# Patient Record
Sex: Male | Born: 1960 | Hispanic: No | Marital: Married | Smoking: Former smoker
Health system: Southern US, Community
[De-identification: ages and names within clinical notes are randomized; demographics above are authoritative.]

## PROBLEM LIST (undated history)

## (undated) DIAGNOSIS — I10 Essential (primary) hypertension: Secondary | ICD-10-CM

## (undated) DIAGNOSIS — E785 Hyperlipidemia, unspecified: Secondary | ICD-10-CM

## (undated) HISTORY — PX: GLAUCOMA REPAIR: SHX214

## (undated) HISTORY — DX: Essential (primary) hypertension: I10

## (undated) HISTORY — DX: Hyperlipidemia, unspecified: E78.5

---

## 2012-11-26 ENCOUNTER — Other Ambulatory Visit: Payer: Self-pay | Admitting: Internal Medicine

## 2012-11-26 DIAGNOSIS — M545 Low back pain: Secondary | ICD-10-CM

## 2012-12-03 ENCOUNTER — Ambulatory Visit
Admission: RE | Admit: 2012-12-03 | Discharge: 2012-12-03 | Disposition: A | Discharge status: Home / Self Care | Payer: No Typology Code available for payment source | Source: Ambulatory Visit | Attending: Internal Medicine | Admitting: Internal Medicine

## 2012-12-03 DIAGNOSIS — M545 Low back pain: Secondary | ICD-10-CM

## 2012-12-04 ENCOUNTER — Other Ambulatory Visit: Payer: Self-pay

## 2013-04-16 HISTORY — PX: BACK SURGERY: SHX140

## 2013-12-14 ENCOUNTER — Other Ambulatory Visit: Payer: Self-pay | Admitting: Internal Medicine

## 2013-12-14 DIAGNOSIS — M545 Low back pain, unspecified: Secondary | ICD-10-CM

## 2013-12-19 ENCOUNTER — Other Ambulatory Visit: Payer: Self-pay

## 2014-08-06 ENCOUNTER — Other Ambulatory Visit: Payer: Self-pay | Admitting: Internal Medicine

## 2014-08-06 DIAGNOSIS — M5410 Radiculopathy, site unspecified: Secondary | ICD-10-CM

## 2014-08-18 ENCOUNTER — Ambulatory Visit
Admission: RE | Admit: 2014-08-18 | Discharge: 2014-08-18 | Disposition: A | Discharge status: Home / Self Care | Payer: BLUE CROSS/BLUE SHIELD | Source: Ambulatory Visit | Attending: Internal Medicine | Admitting: Internal Medicine

## 2014-08-18 DIAGNOSIS — M5410 Radiculopathy, site unspecified: Secondary | ICD-10-CM

## 2019-04-17 HISTORY — PX: HERNIA REPAIR: SHX51

## 2019-05-28 ENCOUNTER — Ambulatory Visit: Payer: BLUE CROSS/BLUE SHIELD | Attending: Internal Medicine

## 2019-05-28 ENCOUNTER — Other Ambulatory Visit: Payer: BLUE CROSS/BLUE SHIELD

## 2019-05-28 DIAGNOSIS — Z20822 Contact with and (suspected) exposure to covid-19: Secondary | ICD-10-CM

## 2019-05-29 LAB — NOVEL CORONAVIRUS, NAA: SARS-CoV-2, NAA: NOT DETECTED

## 2020-08-12 ENCOUNTER — Encounter: Payer: Self-pay | Admitting: Cardiology

## 2020-08-12 ENCOUNTER — Ambulatory Visit: Payer: Self-pay | Admitting: Cardiology

## 2020-08-12 ENCOUNTER — Other Ambulatory Visit: Payer: Self-pay

## 2020-08-12 ENCOUNTER — Telehealth (HOSPITAL_COMMUNITY): Payer: Self-pay | Admitting: Emergency Medicine

## 2020-08-12 VITALS — BP 142/93 | HR 70 | Temp 98.1°F | Resp 17 | Ht 64.0 in | Wt 184.4 lb

## 2020-08-12 DIAGNOSIS — E7800 Pure hypercholesterolemia, unspecified: Secondary | ICD-10-CM

## 2020-08-12 DIAGNOSIS — I10 Essential (primary) hypertension: Secondary | ICD-10-CM

## 2020-08-12 DIAGNOSIS — I208 Other forms of angina pectoris: Secondary | ICD-10-CM

## 2020-08-12 MED ORDER — NITROGLYCERIN 0.4 MG SL SUBL: 0.4000 mg | SUBLINGUAL_TABLET | SUBLINGUAL | 3 refills | Status: AC | PRN

## 2020-08-12 MED ORDER — ATORVASTATIN CALCIUM 40 MG PO TABS: 40.0000 mg | ORAL_TABLET | Freq: Every day | ORAL | 1 refills | Status: DC

## 2020-08-12 MED ORDER — AMLODIPINE BESYLATE 5 MG PO TABS: 5.0000 mg | ORAL_TABLET | Freq: Every evening | ORAL | 1 refills | Status: DC

## 2020-08-12 NOTE — Telephone Encounter (Signed)
Reaching out to patient to offer assistance regarding upcoming cardiac imaging study; pt verbalizes understanding of appt date/time, parking situation and where to check in, pre-test NPO status and medications ordered, and verified current allergies; name and call back number provided for further questions should they arise Rockwell Alexandria RN Navigator Cardiac Imaging [facility] [phone] office [phone] cell   Taking daily metop succ 2 hr prior to scan [place]

## 2020-08-12 NOTE — Progress Notes (Signed)
Primary Physician/Referring:  No primary care provider on file.  Patient ID: Donald Alexander, male    DOB: Apr 07, 1961, 60 y.o.   MRN: [id number]  Chief Complaint  Patient presents with  . New Patient (Initial Visit)       . Chest Pain    Ref by Kirstie Peri, MD  . Hypertension   HPI:    Donald Alexander  is a 60 y.o. Asian [place] male patient with remote history of tobacco use, untreated hypertension and hyperlipidemia, recently started on therapy a month ago, presents for evaluation of left arm discomfort with exertional activity.  For the past 1 month, patient every time he exerts has tightness in his left arm which is relieved with rest.  Otherwise feels well, has no other associated symptoms.  He is accompanied by his wife. He has since reduced his activity. No rest pain. No family history of premature CAD.   Past Medical History:  Diagnosis Date  . Hyperlipidemia   . Hypertension    Past Surgical History:  Procedure Laterality Date  . BACK SURGERY  2015  . GLAUCOMA REPAIR    . HERNIA REPAIR  2021   Family History  Problem Relation Age of Onset  . Osteoarthritis Mother   . Gout Brother     Social History   Tobacco Use  . Smoking status: Former Smoker    Types: Cigarettes    Quit date: 08/12/1988    Years since quitting: 32.0  . Smokeless tobacco: Never Used  Substance Use Topics  . Alcohol use: Yes    Comment: occ   Marital Status: Married  ROS  Review of Systems  Cardiovascular: Positive for chest pain. Negative for dyspnea on exertion and leg swelling.  Gastrointestinal: Negative for melena.   Objective  Blood pressure (!) 142/93, pulse 70, temperature 98.1 F (36.7 C), temperature source Temporal, resp. rate 17, height 5\' 4"  (1.626 m), weight 184 lb 6.4 oz (83.6 kg), SpO2 98 %.   Physical Exam  Constitutional: He appears healthy. No distress.  Eyes: Conjunctivae are normal.  Neck: No JVD present.  Cardiovascular: Regular rhythm, normal heart sounds,  intact distal pulses and normal pulses. Exam reveals no gallop.  No murmur heard. Pulmonary/Chest: Effort normal and breath sounds normal. He exhibits no tenderness.  Abdominal: Soft. Bowel sounds are normal.  Musculoskeletal:        General: No edema. Normal range of motion.     Cervical back: Neck supple.  Neurological: He is alert and oriented to person, place, and time.  Skin: Skin is warm.     Laboratory examination:   No results for input(s): NA, K, CL, CO2, GLUCOSE, BUN, CREATININE, CALCIUM, GFRNONAA, GFRAA in the last 8760 hours. CrCl cannot be calculated (No successful lab value found.).  No flowsheet data found. No flowsheet data found.  Lipid Panel No results for input(s): CHOL, TRIG, LDLCALC, VLDL, HDL, CHOLHDL, LDLDIRECT in the last 8760 hours. Lipid Panel  No results found for: CHOL, TRIG, HDL, CHOLHDL, VLDL, LDLCALC, LDLDIRECT, LABVLDL   HEMOGLOBIN A1C No results found for: HGBA1C, MPG TSH No results for input(s): TSH in the last 8760 hours.  External labs:   Pending from PCP  Medications and allergies   Allergies  Allergen Reactions  . Simvastatin Hives     Current Outpatient Medications on File Prior to Visit  Medication Sig Dispense Refill  . ASPIRIN 81 PO Take 1 tablet by mouth daily.    . metoprolol succinate (TOPROL-XL) 25 MG  24 hr tablet Take 0.5 tablets by mouth daily.     No current facility-administered medications on file prior to visit.     Radiology:   No results found.  Cardiac Studies:   None EKG:     EKG 08/12/2020: Normal sinus rhythm at rate of 68 beats minute, normal axis.  Incomplete right bundle branch block.  No evidence of ischemia, normal EKG.   Assessment     ICD-10-CM   1. Stable angina pectoris (HCC)  I20.8 EKG 12-Lead    amLODipine (NORVASC) 5 MG tablet    nitroGLYCERIN (NITROSTAT) 0.4 MG SL tablet    CT CORONARY MORPH W/CTA COR W/SCORE W/CA W/CM &/OR WO/CM    CT CORONARY FRACTIONAL FLOW RESERVE DATA PREP     CT CORONARY FRACTIONAL FLOW RESERVE FLUID ANALYSIS    Basic metabolic panel  2. Essential hypertension  I10   3. Hypercholesteremia  E78.00 atorvastatin (LIPITOR) 40 MG tablet     Medications Discontinued During This Encounter  Medication Reason  . atorvastatin (LIPITOR) 10 MG tablet Dose change    Meds ordered this encounter  Medications  . amLODipine (NORVASC) 5 MG tablet    Sig: Take 1 tablet (5 mg total) by mouth every evening.    Dispense:  30 tablet    Refill:  1  . atorvastatin (LIPITOR) 40 MG tablet    Sig: Take 1 tablet (40 mg total) by mouth daily.    Dispense:  30 tablet    Refill:  1  . nitroGLYCERIN (NITROSTAT) 0.4 MG SL tablet    Sig: Place 1 tablet (0.4 mg total) under the tongue every 5 (five) minutes as needed for up to 25 days for chest pain.    Dispense:  25 tablet    Refill:  3   Orders Placed This Encounter  Procedures  . CT CORONARY MORPH W/CTA COR W/SCORE W/CA W/CM &/OR WO/CM    Standing Status:   Future    Standing Expiration Date:   09/11/2020    Order Specific Question:   If indicated for the ordered procedure, I authorize the administration of contrast media per Radiology protocol    Answer:   Yes    Order Specific Question:   Preferred Imaging Location?    Answer:   [facility]    Order Specific Question:   Release to patient    Answer:   Immediate  . CT CORONARY FRACTIONAL FLOW RESERVE DATA PREP    FFR Data Prep and Fluid analysis orders will be ordered for pre-authorization.    Standing Status:   Future    Standing Expiration Date:   09/11/2020    Order Specific Question:   Preferred imaging location?    Answer:   [facility]    Order Specific Question:   Radiology Contrast Protocol - do NOT remove file path    Answer:   \\[email or web address].pdf  . CT CORONARY FRACTIONAL FLOW RESERVE FLUID ANALYSIS    FFR Data Prep and Fluid analysis orders will be ordered for pre-authorization.    Standing Status:   Future     Standing Expiration Date:   09/11/2020    Order Specific Question:   Preferred imaging location?    Answer:   [facility]    Order Specific Question:   Radiology Contrast Protocol - do NOT remove file path    Answer:   \\[email or web address].pdf  . Basic metabolic panel  . EKG 12-Lead   Recommendations:   Delrae Rend  is a 60 y.o.  Asian [place] male patient with remote history of tobacco use, untreated hypertension and hyperlipidemia, recently started on therapy a month ago, presents for evaluation of left arm discomfort with exertional activity.   His symptoms are fairly classic for new onset angina pectoris, will increase his atorvastatin from 5 mg to 40 mg daily as I am concerned about high-grade proximal vessel disease.  I will also add amlodipine 5 mg daily both for angina and hypertension control.  Continue aspirin for now.  S/L NTG was prescribed and explained how to and when to use it and to notify us if there is change in frequency of use. Interaction with cialis-like agents (if applicable was discussed). Patient instructed not to do heavy lifting, heavy exertional activity, swimming until evaluation is complete.  Patient instructed to call if symptoms worse or to go to the ED for further evaluation.  I will schedule him for coronary CTA.  Office visit following the work-up/investigations.    Yates Decamp, MD, [facility] 08/13/2020, 5:14 PM Office: [phone] Pager: [phone]

## 2020-08-13 LAB — BASIC METABOLIC PANEL
BUN/Creatinine Ratio: 9 (ref 9–20)
BUN: 10 mg/dL (ref 6–24)
CO2: 22 mmol/L (ref 20–29)
Calcium: 10.2 mg/dL (ref 8.7–10.2)
Chloride: 99 mmol/L (ref 96–106)
Creatinine, Ser: 1.08 mg/dL (ref 0.76–1.27)
Glucose: 114 mg/dL — ABNORMAL HIGH (ref 65–99)
Potassium: 4.6 mmol/L (ref 3.5–5.2)
Sodium: 138 mmol/L (ref 134–144)
eGFR: 80 mL/min/{1.73_m2} (ref >59)

## 2020-08-15 ENCOUNTER — Ambulatory Visit: Payer: Self-pay | Admitting: Cardiology

## 2020-08-16 ENCOUNTER — Ambulatory Visit (HOSPITAL_COMMUNITY)
Admission: RE | Admit: 2020-08-16 | Discharge: 2020-08-16 | Disposition: A | Discharge status: Home / Self Care | Payer: Self-pay | Source: Ambulatory Visit | Attending: Internal Medicine | Admitting: Internal Medicine

## 2020-08-16 ENCOUNTER — Encounter (HOSPITAL_COMMUNITY): Payer: Self-pay

## 2020-08-16 ENCOUNTER — Other Ambulatory Visit: Payer: Self-pay

## 2020-08-16 DIAGNOSIS — I208 Other forms of angina pectoris: Secondary | ICD-10-CM | POA: Insufficient documentation

## 2020-08-16 IMAGING — CT CT HEART MORP W/ CTA COR W/ SCORE W/ CA W/CM &/OR W/O CM
1 series · 8 of 10 positions shown, 10 images · IV contrast (omnipaque)
Comparison: None.
COMPARISON: None.

Addendum:
EXAM:
OVER-READ INTERPRETATION  CT CHEST

The following report is an over-read performed by radiologist Dr.
Glenmore Oafallas [[email or web address]] on 08/16/2020. This
over-read does not include interpretation of cardiac or coronary
anatomy or pathology. The coronary calcium score/coronary CTA
interpretation by the cardiologist is attached.
HISTORY: Chest pain/anginal equiv, ECGs and troponins normal Exertional chest
pain, Hypertension and untreated hypercholesterolemia
Cardiac/Coronary  CT
TECHNIQUE: The patient was scanned on a Siemens Force scanner.
PROTOCOL: A 120 kV prospective scan was triggered in the descending thoracic
aorta at 111 HU's. Axial non-contrast 3 mm slices were carried out
through the heart. The data set was analyzed on a dedicated work
station and scored using the Agatson method. Gantry rotation speed
was 250 msecs and collimation was .6 mm. IV beta blockade and 0.8 mg
of sl NTG was given. The 3D data set was reconstructed in 5%
intervals of the 67-82 % of the R-R cycle. Diastolic phases were
analyzed on a dedicated work station using MPR, MIP and VRT modes.
The patient received 95mL OMNIPAQUE IOHEXOL 350 MG/ML SOLN of
contrast.

[Series 684: — · 0.43mm/px · 8 of 10 slices shown, 10 images]
[im 2/10  vessel]
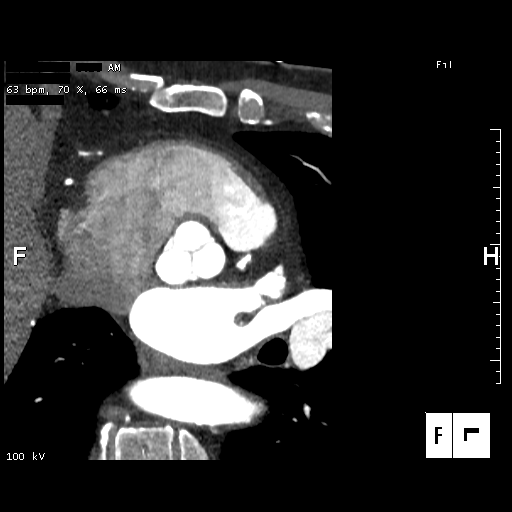
[im 2/10  lung]
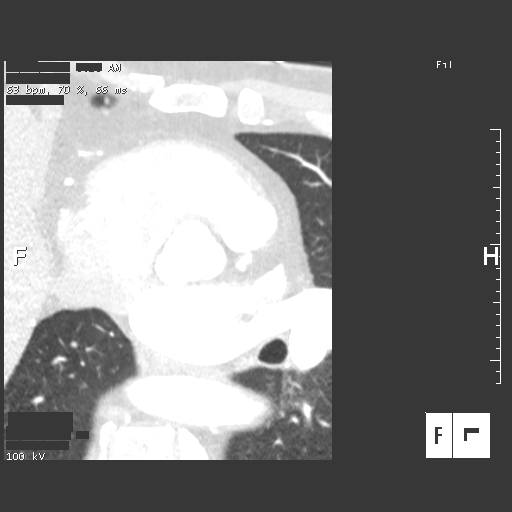
[im 3/10  vessel]
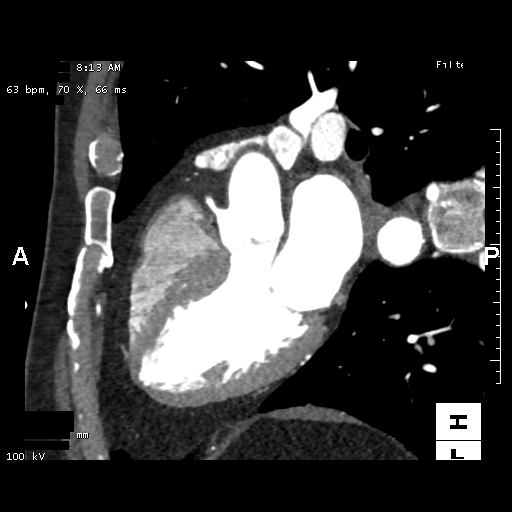
[im 4/10  vessel]
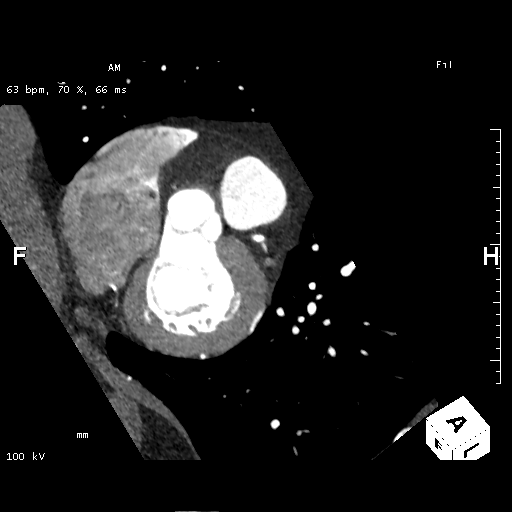
[im 5/10  vessel]
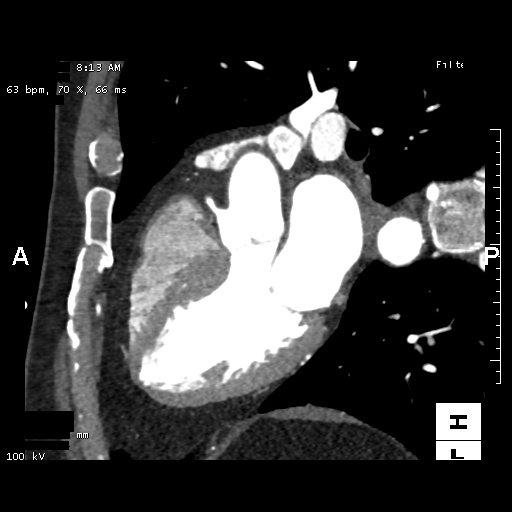
[im 6/10  vessel]
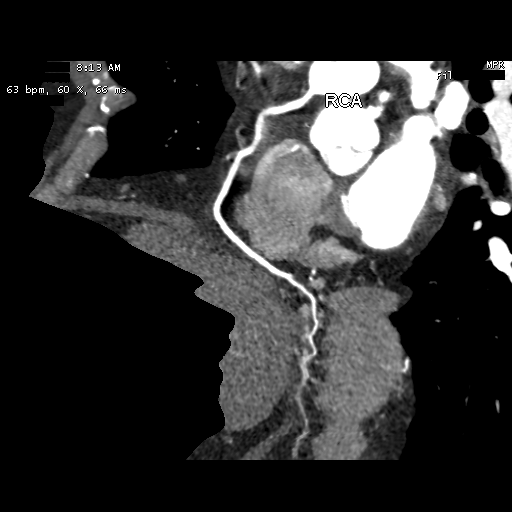
[im 6/10  lung]
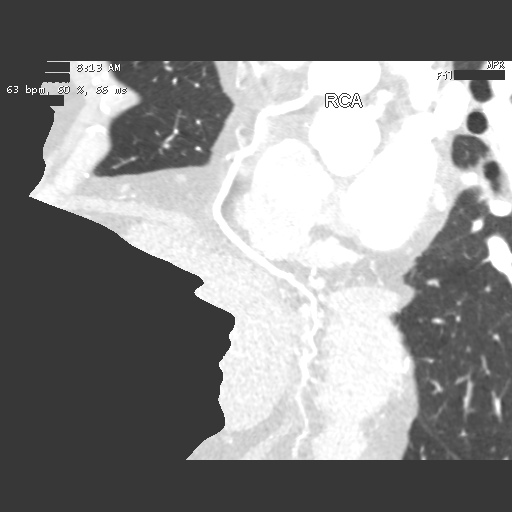
[im 7/10  vessel]
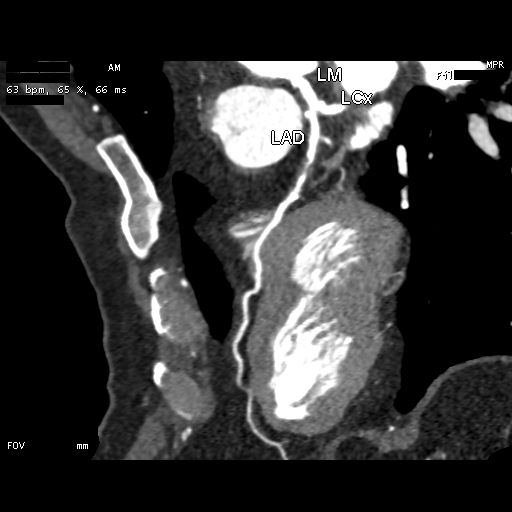
[im 8/10  vessel]
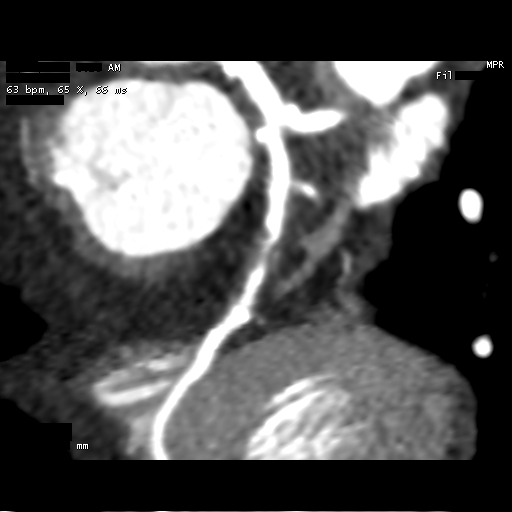
[im 9/10  vessel]
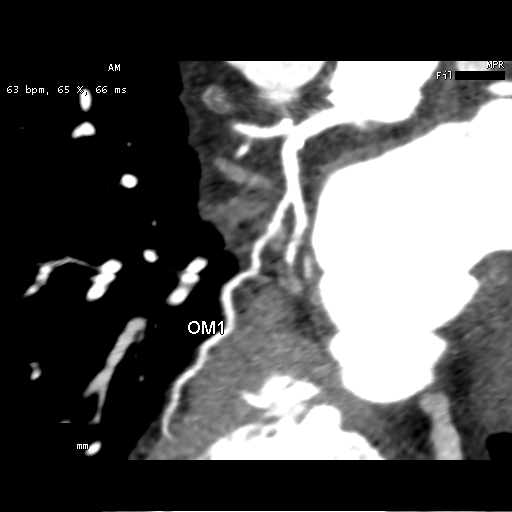

[8 of 10 positions shown; findings below may reference images not displayed]

FINDINGS: Within the visualized portions of the thorax there are no suspicious
appearing pulmonary nodules or masses, there is no acute
consolidative airspace disease, no pleural effusions, no
pneumothorax and no lymphadenopathy. Visualized portions of the
upper abdomen are unremarkable. There are no aggressive appearing
lytic or blastic lesions noted in the visualized portions of the
skeleton.
IMPRESSION: No significant incidental noncardiac findings are noted
FINDINGS: Image quality: excellent.

Noise artifact is: Limited.

Coronary artery calcification score:

Left main:

Left anterior descending artery:

Left circumflex artery: 130

Right coronary artery:

Total coronary calcium score of 237.

Coronary arteries: Normal coronary origins.  Right dominance.

Left Main Coronary Artery: The left main is a normal caliber vessel
with a normal take off from the left coronary cusp that bifurcates
to form a left anterior descending artery and a left circumflex
artery. Minimal stenosis (0-24%) at the ostium due to calcified
plaque otherwise vessel is patent.

Left Anterior Descending Coronary Artery: Normal caliber vessel that
gives off two diagonal branches and reaches the apex. Minimal
stenosis (0-24%) at the ostial LAD due to calcified plaque. Severe
stenosis (70-99%) at proximal / mid LAD due to noncalcified plaque
the lesion is closer to 70% stenosis. Mid to distal LAD overall
patent with minimal calcified plaque.

Left Circumflex Artery: Normal caliber vessel travels within the
atrioventricular groove and gives off one obtuse marginal branch.
Minimal stenosis (0-24%) at the ostial LCx due to calcified plaque.
Moderate stenosis (50-69%) at mid LCx due to noncalcified plaque.
Obtuse marginal branches are overall patent.

Right Coronary Artery: The RCA is dominant with normal take off from
the right coronary cusp. The RCA terminates as a PDA and right
posterolateral branch. Mild stenosis (25-49%) in the mid / distal
segment due to both calcified and noncalcified plaque, otherwise
vessel is overall patent.

Left Atrium: Grossly normal in size with no left atrial appendage
filling defect.

Left Ventricle: Grossly normal in size. There are no stigmata of
prior infarction. There is no abnormal filling defect.

Pulmonary arteries: Normal in size without proximal filling defect.

Pulmonary veins: Normal pulmonary venous drainage.

Aorta: Normal size, 28 mm at the mid ascending aorta (level of the
PA bifurcation) measured double oblique. No calcifications. No
dissection.

Pericardium: Normal thickness with no significant effusion or
calcium present.

Cardiac valves: The aortic valve is trileaflet without
calcification. The mitral valve is normal structure without
calcification.

Extra-cardiac findings: See attached radiology report for
non-cardiac structures.
IMPRESSION: 1.  Total coronary calcium score of 237 KUN.

2. Normal coronary origin with right dominance.

3. CAD-RADS = 4.

Minimal stenosis (0-24%) at the ostium due to calcified plaque
otherwise vessel is patent.

Severe stenosis (70-99%) at proximal / mid LAD due to noncalcified
plaque the lesion is closer to 70% stenosis.

Moderate stenosis (50-69%) at mid LCx due to noncalcified plaque.

Mild stenosis (25-49%) in the mid / distal segment due to both
calcified and noncalcified plaque.

4. Study is sent for CT-FFR findings will be performed and reported
separately.

RECOMMENDATIONS:

Consider symptom-guided anti-ischemic pharmacotherapy as well as
risk factor modification per guideline directed care.

Additional analysis with CT FFR will be submitted to further
evaluate the LAD and LCx lesions.

Consider invasive angiography if either CTFFR notes hemodynamically
significant stenosis or patient continues to have angina despite
uptitration of guideline directed medical therapy and anti-anginal
therapy.

Clinical correlation is recommended.

*** End of Addendum ***
EXAM:
OVER-READ INTERPRETATION  CT CHEST

The following report is an over-read performed by radiologist Dr.
Glenmore Oafallas [REDACTED] on 08/16/2020. This
over-read does not include interpretation of cardiac or coronary
anatomy or pathology. The coronary calcium score/coronary CTA
interpretation by the cardiologist is attached.
FINDINGS: Within the visualized portions of the thorax there are no suspicious
appearing pulmonary nodules or masses, there is no acute
consolidative airspace disease, no pleural effusions, no
pneumothorax and no lymphadenopathy. Visualized portions of the
upper abdomen are unremarkable. There are no aggressive appearing
lytic or blastic lesions noted in the visualized portions of the
skeleton.
IMPRESSION: No significant incidental noncardiac findings are noted

## 2020-08-16 MED ORDER — METOPROLOL TARTRATE 5 MG/5ML IV SOLN
5.0000 mg | INTRAVENOUS | Status: DC | PRN
  Administered 2020-08-16: 5 mg via INTRAVENOUS

## 2020-08-16 MED ORDER — METOPROLOL TARTRATE 5 MG/5ML IV SOLN
INTRAVENOUS | Status: AC
  Filled 2020-08-16: qty 5

## 2020-08-16 MED ORDER — NITROGLYCERIN 0.4 MG SL SUBL
SUBLINGUAL_TABLET | SUBLINGUAL | Status: AC
  Filled 2020-08-16: qty 2

## 2020-08-16 MED ORDER — IOHEXOL 350 MG/ML SOLN
95.0000 mL | Freq: Once | INTRAVENOUS | Status: AC | PRN
  Administered 2020-08-16: 95 mL via INTRAVENOUS

## 2020-08-16 MED ORDER — NITROGLYCERIN 0.4 MG SL SUBL
0.8000 mg | SUBLINGUAL_TABLET | Freq: Once | SUBLINGUAL | Status: AC
  Administered 2020-08-16: 0.8 mg via SUBLINGUAL

## 2020-08-16 NOTE — Progress Notes (Unsigned)
Identify Informed Consent   Subject Name: Donald Alexander  Subject met inclusion and exclusion criteria.  The informed consent form, study requirements and expectations were reviewed with the subject and questions and concerns were addressed prior to the signing of the consent form.  The subject verbalized understanding of the trial requirements.  The subject agreed to participate in the Identify trial and signed the informed consent at [place] on 08/16/2020.  The informed consent was obtained prior to performance of any protocol-specific procedures for the subject.  A copy of the signed informed consent was given to the subject and a copy was placed in the subject's medical record.   [name]

## 2020-08-17 ENCOUNTER — Ambulatory Visit (HOSPITAL_COMMUNITY)
Admission: RE | Admit: 2020-08-17 | Discharge: 2020-08-17 | Disposition: A | Discharge status: Home / Self Care | Payer: Self-pay | Source: Ambulatory Visit | Attending: Cardiology | Admitting: Cardiology

## 2020-08-17 DIAGNOSIS — I208 Other forms of angina pectoris: Secondary | ICD-10-CM | POA: Insufficient documentation

## 2020-08-17 DIAGNOSIS — I2089 Other forms of angina pectoris: Secondary | ICD-10-CM | POA: Insufficient documentation

## 2020-08-18 NOTE — Progress Notes (Signed)
Coronary CTA 08/17/2020: Total coronary calcium score of 237. Left main: 20.6 Left anterior descending artery: 63.9 Left circumflex artery: 130 Right coronary artery: 22.8 Left main is normal.   LAD: Severe stenosis (70 to 99%) at proximal to mid LAD due to noncalcified soft plaque,  CX: 50 to 69% mid circumflex stenosis with noncalcified soft plaque.  Mild stenosis in the distal circumflex with mixed plaque. RCA: PL branch of RCA mild stenosis of 25 to 49%.  Mixed plaque.  Dominant RCA. Both LAD and circumflex stenosis hemodynamically stable by CT FFR. Other visualized noncardiac structures within normal limits.

## 2020-08-19 ENCOUNTER — Other Ambulatory Visit: Payer: Self-pay

## 2020-08-19 ENCOUNTER — Ambulatory Visit: Payer: Self-pay | Admitting: Cardiology

## 2020-08-19 ENCOUNTER — Encounter: Payer: Self-pay | Admitting: Cardiology

## 2020-08-19 VITALS — BP 156/88 | HR 76 | Temp 98.3°F | Resp 17 | Ht 64.0 in | Wt 185.0 lb

## 2020-08-19 DIAGNOSIS — I25118 Atherosclerotic heart disease of native coronary artery with other forms of angina pectoris: Secondary | ICD-10-CM

## 2020-08-19 DIAGNOSIS — I208 Other forms of angina pectoris: Secondary | ICD-10-CM

## 2020-08-19 DIAGNOSIS — E7800 Pure hypercholesterolemia, unspecified: Secondary | ICD-10-CM

## 2020-08-19 DIAGNOSIS — I10 Essential (primary) hypertension: Secondary | ICD-10-CM

## 2020-08-19 MED ORDER — METOPROLOL SUCCINATE ER 50 MG PO TB24: 50.0000 mg | ORAL_TABLET | Freq: Every day | ORAL | 2 refills | Status: DC

## 2020-08-19 MED ORDER — AMLODIPINE BESYLATE 10 MG PO TABS: 10.0000 mg | ORAL_TABLET | Freq: Every day | ORAL | 1 refills | Status: DC

## 2020-08-19 NOTE — Progress Notes (Addendum)
Primary Physician/Referring:  Pcp, No  Patient ID: Donald Alexander, male    DOB: 09-02-1960, 60 y.o.   MRN: [id number]  Chief Complaint  Patient presents with  . Chest Pain   HPI:    Donald Alexander  is a 60 y.o. [place] male patient with remote history of tobacco use, untreated hypertension and hyperlipidemia, recently started on therapy a month ago, presents for evaluation of angina pectoris.  I had increased his atorvastatin from 5 mg to 40 mg and also started him on amlodipine 5 mg and increased his metoprolol succinate from 12.5 mg to 25 mg daily which he is tolerating.  He has noticed improvement in symptoms of angina with exertional activity and is accompanied by his wife.  States that his symptoms of angina has improved since being on aggressive medical therapy and is tolerating all his medications well.  He has continued to do his routine activities but has not exerted himself.  No rest pain, he has not used sublingual nitroglycerin.  Past Medical History:  Diagnosis Date  . Hyperlipidemia   . Hypertension    Past Surgical History:  Procedure Laterality Date  . BACK SURGERY  2015  . GLAUCOMA REPAIR    . HERNIA REPAIR  2021   Family History  Problem Relation Age of Onset  . Osteoarthritis Mother   . Gout Brother     Social History   Tobacco Use  . Smoking status: Former Smoker    Types: Cigarettes    Quit date: 08/12/1988    Years since quitting: 32.0  . Smokeless tobacco: Never Used  Substance Use Topics  . Alcohol use: Yes    Comment: occ   Marital Status: Married  ROS  Review of Systems  Cardiovascular: Positive for chest pain. Negative for dyspnea on exertion and leg swelling.  Gastrointestinal: Negative for melena.   Objective  Blood pressure (!) 156/88, pulse 76, temperature 98.3 F (36.8 C), temperature source Temporal, resp. rate 17, height _0  (1.626 m), weight 185 lb (83.9 kg), SpO2 99 %.   Physical Exam  Constitutional: He appears healthy.  No distress.  Eyes: Conjunctivae are normal.  Neck: No JVD present.  Cardiovascular: Regular rhythm, normal heart sounds, intact distal pulses and normal pulses. Exam reveals no gallop.  No murmur heard. Pulmonary/Chest: Effort normal and breath sounds normal. He exhibits no tenderness.  Abdominal: Soft. Bowel sounds are normal.  Musculoskeletal:        General: No edema. Normal range of motion.     Cervical back: Neck supple.  Neurological: He is alert and oriented to person, place, and time.  Skin: Skin is warm.     Laboratory examination:   Recent Labs    08/12/20 1123  NA 138  K 4.6  CL 99  CO2 22  GLUCOSE 114*  BUN 10  CREATININE 1.08  CALCIUM 10.2   estimated creatinine clearance is 72.9 mL/min (by C-G formula based on SCr of 1.08 mg/dL).  CMP Latest Ref Rng & Units 08/12/2020  Glucose 65 - 99 mg/dL 114(H)  BUN 6 - 24 mg/dL 10  Creatinine 0.76 - 1.27 mg/dL 1.08  Sodium 134 - 144 mmol/L 138  Potassium 3.5 - 5.2 mmol/L 4.6  Chloride 96 - 106 mmol/L 99  CO2 20 - 29 mmol/L 22  Calcium 8.7 - 10.2 mg/dL 10.2   No flowsheet data found.  Lipid Panel No results for input(s): CHOL, TRIG, LDLCALC, VLDL, HDL, CHOLHDL, LDLDIRECT in the last 8760 hours.  Lipid Panel  No results found for: CHOL, TRIG, HDL, CHOLHDL, VLDL, LDLCALC, LDLDIRECT, LABVLDL   HEMOGLOBIN A1C No results found for: HGBA1C, MPG TSH No results for input(s): TSH in the last 8760 hours.  External labs:   Labs 04/05/2020:  Uric acid normal at 7.9.  TSH normal at 2.100.  PSA normal at 0.6.  Total cholesterol 253, triglycerides 189, HDL 31, LDL 186.  Hb 14.8/HCT 45.0, platelets 300.  Mild microcytic index.  Serum glucose _0 mg, BUN 11, creatinine 1.03, EGFR 80 mL, potassium 4.5, CMP otherwise normal.  Medications and allergies   Allergies  Allergen Reactions  . Simvastatin Hives     Current Outpatient Medications on File Prior to Visit  Medication Sig Dispense Refill  . ASPIRIN 81 PO Take  1 tablet by mouth daily.    Marland Kitchen atorvastatin (LIPITOR) 40 MG tablet Take 1 tablet (40 mg total) by mouth daily. 30 tablet 1  . nitroGLYCERIN (NITROSTAT) 0.4 MG SL tablet Place 1 tablet (0.4 mg total) under the tongue every 5 (five) minutes as needed for up to 25 days for chest pain. 25 tablet 3   No current facility-administered medications on file prior to visit.     Radiology:   No results found.  Cardiac Studies:   None EKG:     EKG 08/12/2020: Normal sinus rhythm at rate of 68 beats minute, normal axis.  Incomplete right bundle branch block.  No evidence of ischemia, normal EKG.   Assessment     ICD-10-CM   1. Coronary artery disease of native artery of native heart with stable angina pectoris ([place])  I25.118   2. Stable angina pectoris (HCC)  I20.8   3. Essential hypertension  I10   4. Hypercholesteremia  E78.00      Medications Discontinued During This Encounter  Medication Reason  . metoprolol succinate (TOPROL-XL) 25 MG 24 hr tablet Dose change  . amLODipine (NORVASC) 5 MG tablet Dose change    Meds ordered this encounter  Medications  . metoprolol succinate (TOPROL-XL) 50 MG 24 hr tablet    Sig: Take 1 tablet (50 mg total) by mouth daily. Take with or immediately following a meal.    Dispense:  30 tablet    Refill:  2  . amLODipine (NORVASC) 10 MG tablet    Sig: Take 1 tablet (10 mg total) by mouth daily.    Dispense:  90 tablet    Refill:  1   No orders of the defined types were placed in this encounter.  Recommendations:   Donald Alexander is a 60 y.o.  [place] male patient with remote history of tobacco use, untreated hypertension and hyperlipidemia, recently started on therapy a month ago, presents for evaluation of angina pectoris.  I had increased his atorvastatin from 5 mg to 40 mg and also started him on amlodipine 5 mg and increased his metoprolol succinate from 12.5 mg to 25 mg daily which he is tolerating.  He has noticed improvement in symptoms of  angina with exertional activity and is accompanied by his wife.  I reviewed the results of the coronary CTA and also CT FFR.  Extensive discussion held, patient prefers medical therapy.  High risk in view of proximal LAD stenosis discussed.  States that he will be extremely careful with his diet and exercise and if he has rest pain or he has to sublingual nitroglycerin frequently or has chest discomfort at night, he agrees to present to the emergency room.  He is aware to increase his physical activity slowly and gradually.  He will need lipid profile testing in about 4 to 6 weeks.  Will further increase his metoprolol succinate from 25 mg to 50 mg daily.  Also his blood pressure is still high.  Weight loss extensively discussed with the patient.  Dietary restriction discussed.  I would like to see him back in 3 weeks for follow-up.  Weight loss extensively discussed with the patient.  His wife is present.         Adrian Prows, MD, [facility] 08/20/2020, 9:19 AM Office: [phone] Pager: [phone]

## 2020-09-26 ENCOUNTER — Ambulatory Visit: Payer: Self-pay | Admitting: Cardiology

## 2020-09-26 ENCOUNTER — Encounter: Payer: Self-pay | Admitting: Cardiology

## 2020-09-26 ENCOUNTER — Other Ambulatory Visit: Payer: Self-pay

## 2020-09-26 VITALS — BP 133/75 | HR 60 | Temp 98.2°F | Resp 17 | Ht 64.0 in | Wt 171.2 lb

## 2020-09-26 DIAGNOSIS — I25118 Atherosclerotic heart disease of native coronary artery with other forms of angina pectoris: Secondary | ICD-10-CM

## 2020-09-26 DIAGNOSIS — E7800 Pure hypercholesterolemia, unspecified: Secondary | ICD-10-CM

## 2020-09-26 DIAGNOSIS — I10 Essential (primary) hypertension: Secondary | ICD-10-CM

## 2020-09-26 NOTE — Progress Notes (Signed)
Primary Physician/Referring:  Monico Blitz, MD  Patient ID: Donald Alexander, male    DOB: 04/28/1960, 60 y.o.   MRN: [id number]  Chief Complaint  Patient presents with   Coronary Artery Disease    3 WEEKS   HPI:    Donald Alexander  is a 60 y.o. [place] male patient with remote history of tobacco use, hypertension and hyperlipidemia, CAD by coronary CTA, presents for evaluation of angina pectoris.  [name], since then he has not had any further episodes of angina pectoris.  He is accompanied by his wife.  [name] since last office visit 6 weeks ago and since being on aggressive medical therapy.  He has also lost about 15 pounds in weight and has been very aggressive and dieting and has been walking on a daily basis with his wife without any discomfort.  No rest pain, he has not used sublingual nitroglycerin.  Past Medical History:  Diagnosis Date   Hyperlipidemia    Hypertension    Past Surgical History:  Procedure Laterality Date   BACK SURGERY  2015   GLAUCOMA REPAIR     HERNIA REPAIR  2021   Family History  Problem Relation Age of Onset   Osteoarthritis Mother    Gout Brother     Social History   Tobacco Use   Smoking status: Former    Pack years: 0.00    Types: Cigarettes    Quit date: 08/12/1988    Years since quitting: 32.1   Smokeless tobacco: Never  Substance Use Topics   Alcohol use: Yes    Comment: occ   Marital Status: Married  ROS  Review of Systems  Cardiovascular:  Negative for chest pain, dyspnea on exertion and leg swelling.  Gastrointestinal:  Negative for melena.  Objective  Blood pressure 133/75, pulse 60, temperature 98.2 F (36.8 C), temperature source Temporal, resp. rate 17, height _0  (1.626 m), weight 171 lb 3.2 oz (77.7 kg), SpO2 99 %.   Vitals with BMI 09/26/2020 09/26/2020 08/19/2020   Height - _1  -  Weight - 171 lbs 3 oz -  BMI - 16.94 -  Systolic 503 888 280  Diastolic 75 79 88  Pulse 60 61 76     Physical Exam  Constitutional: He appears healthy. No distress.  Eyes: Conjunctivae are normal.  Neck: No JVD present.  Cardiovascular: Regular rhythm, normal heart sounds, intact distal pulses and normal pulses. Exam reveals no gallop.  No murmur heard. Pulmonary/Chest: Effort normal and breath sounds normal. He exhibits no tenderness.  Abdominal: Soft. Bowel sounds are normal.  Musculoskeletal:        General: No edema. Normal range of motion.     Cervical back: Neck supple.  Neurological: He is alert and oriented to person, place, and time.  Skin: Skin is warm.    Laboratory examination:   Recent Labs    08/12/20 1123  NA 138  K 4.6  CL 99  CO2 22  GLUCOSE 114*  BUN 10  CREATININE 1.08  CALCIUM 10.2   CrCl cannot be calculated (Patient's most recent lab result is older than the maximum 21 days allowed.).  CMP Latest Ref Rng & Units 08/12/2020  Glucose 65 - 99 mg/dL 114(H)  BUN 6 - 24 mg/dL 10  Creatinine 0.76 - 1.27 mg/dL 1.08  Sodium 134 - 144 mmol/L  138  Potassium 3.5 - 5.2 mmol/L 4.6  Chloride 96 - 106 mmol/L 99  CO2 20 - 29 mmol/L 22  Calcium 8.7 - 10.2 mg/dL 10.2   No flowsheet data found.  Lipid Panel No results for input(s): CHOL, TRIG, LDLCALC, VLDL, HDL, CHOLHDL, LDLDIRECT in the last 8760 hours. Lipid Panel  No results found for: CHOL, TRIG, HDL, CHOLHDL, VLDL, LDLCALC, LDLDIRECT, LABVLDL    External labs:   Labs 04/05/2020:  Uric acid normal at 7.9.  TSH normal at 2.100.  PSA normal at 0.6.  Total cholesterol 253, triglycerides 189, HDL 31, LDL 186.  Hb 14.8/HCT 45.0, platelets 300.  Mild microcytic index.  Serum glucose _0 mg, BUN 11, creatinine 1.03, EGFR 80 mL, potassium 4.5, CMP otherwise normal.  Medications and allergies   Allergies  Allergen Reactions   Simvastatin Hives     Current Outpatient  Medications on File Prior to Visit  Medication Sig Dispense Refill   amLODipine (NORVASC) 10 MG tablet Take 1 tablet (10 mg total) by mouth daily. 90 tablet 1   ASPIRIN 81 PO Take 1 tablet by mouth daily.     atorvastatin (LIPITOR) 40 MG tablet Take 1 tablet (40 mg total) by mouth daily. 30 tablet 1   metoprolol succinate (TOPROL-XL) 50 MG 24 hr tablet Take 1 tablet (50 mg total) by mouth daily. Take with or immediately following a meal. 30 tablet 2   nitroGLYCERIN (NITROSTAT) 0.4 MG SL tablet Place 1 tablet (0.4 mg total) under the tongue every 5 (five) minutes as needed for up to 25 days for chest pain. 25 tablet 3   No current facility-administered medications on file prior to visit.     Radiology:   No results found.  Cardiac Studies:   Coronary CT angiogram Aug 27, 2020: 1. Total coronary calcium score of 237. Left main: 20.6; Left anterior descending artery: 63.9; Left circumflex artery: 130; Right coronary artery: 22.8 2. Normal coronary origin with right dominance. 3. Severe stenosis (70-99%) at proximal / mid LAD due to noncalcified plaque the lesion is closer to 70% stenosis. 4. Moderate stenosis (50-69%) at mid LCx due to noncalcified plaque. 5. Mild stenosis (25-49%) in the mid / distal segment due to both calcified and noncalcified plaque   1. Left Main: FFR = 0.99 2. LAD: Proximal FFR = 0.98, mid FFR = 0.66, distal FFR = 0.59 3. LCX: Proximal FFR = 0.98, mid FFR = 0.97, distal FFR = 0.79 4. RCA: Proximal FFR = 0.97, mid FFR =0.89, distal FFR = 0.84   IMPRESSION: 1. CT FFR analysis showed significant stenosis at proximal to mid LAD and mid to distal LCx.   RECOMMENDATIONS: Invasive angiography is recommended to further evaluate the stenosis outline in the LAD and LCX distribution.    EKG:   EKG 08/12/2020: Normal sinus rhythm at rate of 68 beats minute, normal axis.  Incomplete right bundle branch block.  No evidence of ischemia, normal EKG.   Assessment      ICD-10-CM   1. Coronary artery disease of native artery of native heart with stable angina pectoris ([place])  I25.118     2. Essential hypertension  I10     3. Hypercholesteremia  E78.00 Lipid Panel With LDL/HDL Ratio    Lipoprotein A (LPA)    Apo A1 + B + Ratio       There are no discontinued medications.   No orders of the defined types were placed in this encounter.  Orders Placed This  Encounter  Procedures   Lipid Panel With LDL/HDL Ratio   Lipoprotein A (LPA)   Apo A1 + B + Ratio    Recommendations:   Donald Alexander is a 60 y.o.  [place] male patient with remote history of tobacco use, hypertension and hyperlipidemia, CAD by coronary CTA, presents for evaluation of angina pectoris.  [name], since then he has not had any further episodes of angina pectoris.  He is accompanied by his wife.  [name] since last office visit 6 weeks ago and since being on aggressive medical therapy.  He has not had any further episodes of angina and he has not used any sublingual nitroglycerin.  His blood pressure is well controlled, he needs lipid profile testing, I will also obtain LPA and APO A1: B ratio.Marland Kitchen  He has lost about 15 pounds in weight over the past 6 weeks.  Advised him to continue to his present lifestyle and I would like to see him back in 6 months for follow-up unless he has recurrence of angina within an hour or threshold for cardiac catheterization.  His wife is present at the bedside and all questions answered.    Adrian Prows, MD, [facility], [place] 09/26/2020, 11:15 PM Office: [phone] Pager: [phone]

## 2020-10-13 ENCOUNTER — Other Ambulatory Visit: Payer: Self-pay

## 2020-10-13 DIAGNOSIS — E7800 Pure hypercholesterolemia, unspecified: Secondary | ICD-10-CM

## 2020-10-13 MED ORDER — ATORVASTATIN CALCIUM 40 MG PO TABS: 40.0000 mg | ORAL_TABLET | Freq: Every day | ORAL | 3 refills | Status: DC

## 2020-10-28 LAB — LIPID PANEL WITH LDL/HDL RATIO
Cholesterol, Total: 104 mg/dL (ref 100–199)
HDL: 31 mg/dL — ABNORMAL LOW (ref >39)
LDL Chol Calc (NIH): 57 mg/dL (ref 0–99)
LDL/HDL Ratio: 1.8 ratio (ref 0.0–3.6)
Triglycerides: 81 mg/dL (ref 0–149)
VLDL Cholesterol Cal: 16 mg/dL (ref 5–40)

## 2020-10-28 LAB — APO A1 + B + RATIO
Apolipo. B/A-1 Ratio: 0.6 ratio (ref 0.0–0.7)
Apolipoprotein A-1: 93 mg/dL — ABNORMAL LOW (ref 101–178)
Apolipoprotein B: 60 mg/dL (ref <90)

## 2020-10-28 LAB — LIPOPROTEIN A (LPA): Lipoprotein (a): 18.6 nmol/L (ref <75.0)

## 2020-11-14 ENCOUNTER — Telehealth: Payer: Self-pay | Admitting: *Deleted

## 2020-11-14 DIAGNOSIS — Z006 Encounter for examination for normal comparison and control in clinical research program: Secondary | ICD-10-CM

## 2020-11-14 NOTE — Telephone Encounter (Signed)
I called patient for 90-day Identify Study phone call. Patient is doing well and having no cardiac symptoms. I reminded patient I would call him in May 2023 for 1-year follow-up.

## 2020-11-28 NOTE — Progress Notes (Signed)
Coronary CTA 08/21/2020: 1. Left Main: FFR = 0.99 2. LAD: Proximal FFR = 0.98, mid FFR = 0.66, distal FFR = 0.59 3. LCX: Proximal FFR = 0.98, mid FFR = 0.97, distal FFR = 0.79 4. RCA: Proximal FFR = 0.97, mid FFR =0.89, distal FFR = 0.84  IMPRESSION: 1. CT FFR analysis showed significant stenosis at proximal to mid LAD and mid to distal LCx.  RECOMMENDATIONS: Invasive angiography is recommended to further evaluate the stenosis outline in the LAD and LCX distribution.

## 2020-12-21 ENCOUNTER — Other Ambulatory Visit: Payer: Self-pay | Admitting: Cardiology

## 2021-01-02 ENCOUNTER — Other Ambulatory Visit: Payer: Self-pay

## 2021-01-02 ENCOUNTER — Telehealth: Payer: Self-pay | Admitting: Cardiology

## 2021-01-02 DIAGNOSIS — E7800 Pure hypercholesterolemia, unspecified: Secondary | ICD-10-CM

## 2021-01-02 MED ORDER — ATORVASTATIN CALCIUM 40 MG PO TABS: 40.0000 mg | ORAL_TABLET | Freq: Every day | ORAL | 3 refills | Status: DC

## 2021-01-02 NOTE — Telephone Encounter (Signed)
Done

## 2021-01-02 NOTE — Telephone Encounter (Signed)
Pt wife calling req refill on medication with a change in pharmacy

## 2021-02-23 ENCOUNTER — Other Ambulatory Visit: Payer: Self-pay | Admitting: Cardiology

## 2021-03-20 ENCOUNTER — Other Ambulatory Visit: Payer: Self-pay | Admitting: Cardiology

## 2021-05-11 ENCOUNTER — Ambulatory Visit: Payer: Self-pay | Admitting: Cardiology

## 2021-05-31 ENCOUNTER — Other Ambulatory Visit: Payer: Self-pay

## 2021-05-31 MED ORDER — AMLODIPINE BESYLATE 10 MG PO TABS: 10.0000 mg | ORAL_TABLET | Freq: Every day | ORAL | 0 refills | Status: DC

## 2021-06-02 ENCOUNTER — Ambulatory Visit: Payer: Self-pay | Admitting: Cardiology

## 2021-06-22 ENCOUNTER — Encounter: Payer: Self-pay | Admitting: Cardiology

## 2021-06-22 ENCOUNTER — Other Ambulatory Visit: Payer: Self-pay

## 2021-06-22 ENCOUNTER — Ambulatory Visit: Payer: Self-pay | Admitting: Cardiology

## 2021-06-22 VITALS — BP 125/73 | HR 65 | Temp 98.4°F | Resp 17 | Ht 64.0 in | Wt 172.6 lb

## 2021-06-22 DIAGNOSIS — E7800 Pure hypercholesterolemia, unspecified: Secondary | ICD-10-CM

## 2021-06-22 DIAGNOSIS — R739 Hyperglycemia, unspecified: Secondary | ICD-10-CM

## 2021-06-22 DIAGNOSIS — I10 Essential (primary) hypertension: Secondary | ICD-10-CM

## 2021-06-22 DIAGNOSIS — I25118 Atherosclerotic heart disease of native coronary artery with other forms of angina pectoris: Secondary | ICD-10-CM

## 2021-06-22 MED ORDER — METOPROLOL SUCCINATE ER 50 MG PO TB24: ORAL_TABLET | ORAL | 2 refills | Status: DC

## 2021-06-22 MED ORDER — ATORVASTATIN CALCIUM 40 MG PO TABS: 40.0000 mg | ORAL_TABLET | Freq: Every day | ORAL | 3 refills | Status: DC

## 2021-06-22 NOTE — Progress Notes (Signed)
? ?Primary Physician/Referring:  Monico Blitz, MD ? ?Patient ID: Donald Alexander, male    DOB: 09-01-1960, 61 y.o.   MRN: [id number] ? ?Chief Complaint  ?Patient presents with  ? Follow-up  ?  6 month  ? Coronary Artery Disease  ? ?HPI:   ? ?Donald Alexander  is a 61 y.o. [place] male patient with remote history of tobacco use, hypertension and hyperlipidemia, CAD by coronary CTA, presents for evaluation of angina pectoris.  [name], since then he has not had any further episodes of angina pectoris.  He is accompanied by his wife. ? ?[name], he has not used sublingual nitroglycerin. ? ?Past Medical History:  ?Diagnosis Date  ? Hyperlipidemia   ? Hypertension   ? ?Past Surgical History:  ?Procedure Laterality Date  ? BACK SURGERY  2015  ? GLAUCOMA REPAIR    ? HERNIA REPAIR  2021  ? ?Family History  ?Problem Relation Age of Onset  ? Osteoarthritis Mother   ? Gout Brother   ?  ?Social History  ? ?Tobacco Use  ? Smoking status: Former  ?  Types: Cigarettes  ?  Quit date: 08/12/1988  ?  Years since quitting: 32.8  ? Smokeless tobacco: Never  ?Substance Use Topics  ? Alcohol use: Yes  ?  Comment: occ  ? ?Marital Status: Married  ?ROS  ?Review of Systems  ?Cardiovascular:  Negative for chest pain, dyspnea on exertion and leg swelling.  ?Gastrointestinal:  Negative for melena.  ?Objective  ?Blood pressure 125/73, pulse 65, temperature 98.4 ?F (36.9 ?C), temperature source Temporal, resp. rate 17, height _0  (1.626 m), weight 172 lb 9.6 oz (78.3 kg), SpO2 98 %.  ? ?Vitals with BMI 06/22/2021 06/22/2021 09/26/2020  ?Height - _1  -  ?Weight - 172 lbs 10 oz -  ?BMI - 29.61 -  ?Systolic 450 388 828  ?Diastolic 73 73 75  ?Pulse 65 68 60  ?  ? Physical Exam  ?Constitutional: He appears healthy. No distress.  ?Eyes: Conjunctivae are normal.  ?Neck: No JVD present.  ?Cardiovascular:  Regular rhythm, normal heart sounds, intact distal pulses and normal pulses. Exam reveals no gallop.  ?No murmur heard. ?Pulmonary/Chest: Effort normal and breath sounds normal. He exhibits no tenderness.  ?Abdominal: Soft. Bowel sounds are normal.  ?Musculoskeletal:     ?   General: No edema. Normal range of motion.  ?   Cervical back: Neck supple.  ?Neurological: He is alert and oriented to person, place, and time.  ?Skin: Skin is warm.   ? ?Laboratory examination:  ? ?Recent Labs  ?  08/12/20 ?1123  ?NA 138  ?K 4.6  ?CL 99  ?CO2 22  ?GLUCOSE 114*  ?BUN 10  ?CREATININE 1.08  ?CALCIUM 10.2  ? ?CrCl cannot be calculated (Patient's most recent lab result is older than the maximum 21 days allowed.).  ?CMP Latest Ref Rng & Units 08/12/2020  ?Glucose 65 - 99 mg/dL 114(H)  ?BUN 6 - 24 mg/dL 10  ?Creatinine 0.76 - 1.27 mg/dL 1.08  ?Sodium 134 - 144 mmol/L 138  ?Potassium 3.5 - 5.2 mmol/L 4.6  ?Chloride 96 - 106 mmol/L 99  ?CO2 20 - 29 mmol/L 22  ?Calcium 8.7 - 10.2 mg/dL 10.2  ? ?No flowsheet data found. ? ?Lipid Panel ?Recent Labs  ?  10/27/20 ?1250  ?CHOL 104  ?TRIG 81  ?  [place] 57  ?HDL 31*  ? ?Lipid Panel  ?   ?Component Value Date/Time  ? CHOL 104 10/27/2020 1250  ? TRIG 81 10/27/2020 1250  ? HDL 31 (L) 10/27/2020 1250  ? LDLCALC 57 10/27/2020 1250  ? LABVLDL 16 10/27/2020 1250  ?  ?Labs 10/27/2020: ?Apolipo. B/A-1 Ratio 0.0 - 0.7 ratio  0.6  ?Apolipoprotein A-1 101 - 178 mg/dL  93 Low   ?Apolipoprotein B <90 mg/dL  60  ? ?Lipoprotein (a) <75.0 nmol/L   18.6  ? ?External labs:  ? ?Labs 04/05/2020: ? ?Uric acid normal at 7.9.  TSH normal at 2.100.  PSA normal at 0.6. ? ?Total cholesterol 253, triglycerides 189, HDL 31, LDL 186. ? ?Hb 14.8/HCT 45.0, platelets 300.  Mild microcytic index. ? ?Serum glucose _0 mg, BUN 11, creatinine 1.03, EGFR 80 mL, potassium 4.5, CMP otherwise normal. ? ?Medications and allergies  ? ?Allergies  ?Allergen Reactions  ? Simvastatin Hives  ?  ? ?Current Outpatient Medications:  ?   amLODipine (NORVASC) 10 MG tablet, Take 1 tablet (10 mg total) by mouth daily., Disp: 90 tablet, Rfl: 0 ?  ASPIRIN 81 PO, Take 1 tablet by mouth daily., Disp: , Rfl:  ?  nitroGLYCERIN (NITROSTAT) 0.4 MG SL tablet, Place 1 tablet (0.4 mg total) under the tongue every 5 (five) minutes as needed for up to 25 days for chest pain., Disp: 25 tablet, Rfl: 3 ?  atorvastatin (LIPITOR) 40 MG tablet, Take 1 tablet (40 mg total) by mouth daily., Disp: 90 tablet, Rfl: 3 ?  metoprolol succinate (TOPROL-XL) 50 MG 24 hr tablet, TAKE 1 TABLET BY MOUTH ONCE DAILY TAKE  WITH  OR  IMMEDIATELY  FOLLOWING  A  MEAL, Disp: 100 tablet, Rfl: 2  ? ?Radiology:  ? ?No results found. ? ?Cardiac Studies:  ? ?Coronary CT angiogram 08/16/2020: ?1. Total coronary calcium score of 237. Left main: 20.6; Left anterior descending artery: 63.9; Left circumflex artery: 130; Right coronary artery: 22.8 ?2. Normal coronary origin with right dominance. ?3. Severe stenosis (70-99%) at proximal / mid LAD due to noncalcified plaque the lesion is closer to 70% stenosis. ?4. Moderate stenosis (50-69%) at mid LCx due to noncalcified plaque. ?5. Mild stenosis (25-49%) in the mid / distal segment due to both calcified and noncalcified plaque ?  ?1. Left Main: FFR = 0.99 ?2. LAD: Proximal FFR = 0.98, mid FFR = 0.66, distal FFR = 0.59 ?3. LCX: Proximal FFR = 0.98, mid FFR = 0.97, distal FFR = 0.79 ?4. RCA: Proximal FFR = 0.97, mid FFR =0.89, distal FFR = 0.84 ?  ?IMPRESSION: ?1. CT FFR analysis showed significant stenosis at proximal to mid LAD and mid to distal LCx. ?  ?RECOMMENDATIONS: ?Invasive angiography is recommended to further evaluate the stenosis outline in the LAD and LCX distribution. ? ? ? ?EKG:  ? ?EKG 06/22/2021: Normal sinus rhythm heart rate of 61 bpm, incomplete right bundle branch block.  No significant change from 07/23/2020. ? ?Assessment  ? ?  ICD-10-CM   ?1. Coronary artery disease of native artery of native heart with stable angina pectoris ([facility])   I25.118 metoprolol succinate (TOPROL-XL) 50 MG 24 hr tablet  ?  ?2. Essential hypertension  I10 EKG 12-Lead  ?  CMP14+EGFR  ?  ?3. Hypercholesteremia  E78.00 Lipid Panel With LDL/HDL Ratio  ?  atorvastatin (LIPITOR) 40 MG tablet  ?  ?4. Hyperglycemia  R73.9 Hgb A1c w/o eAG  ?  ?  ? ?Medications Discontinued  During This Encounter  ?Medication Reason  ? atorvastatin (LIPITOR) 40 MG tablet Reorder  ? metoprolol succinate (TOPROL-XL) 50 MG 24 hr tablet Reorder  ? ?  ?Meds ordered this encounter  ?Medications  ? atorvastatin (LIPITOR) 40 MG tablet  ?  Sig: Take 1 tablet (40 mg total) by mouth daily.  ?  Dispense:  90 tablet  ?  Refill:  3  ? metoprolol succinate (TOPROL-XL) 50 MG 24 hr tablet  ?  Sig: TAKE 1 TABLET BY MOUTH ONCE DAILY TAKE  WITH  OR  IMMEDIATELY  FOLLOWING  A  MEAL  ?  Dispense:  100 tablet  ?  Refill:  2  ? ? ?Orders Placed This Encounter  ?Procedures  ? Lipid Panel With LDL/HDL Ratio  ? CMP14+EGFR  ? Hgb A1c w/o eAG  ? EKG 12-Lead  ? ? ?Recommendations:  ? ?Donald Alexander is a 61 y.o.  [place] male patient with remote history of tobacco use, hypertension and hyperlipidemia, CAD by coronary CTA, presents for evaluation of angina pectoris. ? ?Is presently doing well and has not had recurrence of angina pectoris.  His wife is present. ? ?I reviewed his labs, lipids under excellent control, he needs repeat lipid profile testing along with CMP and A1c evaluation for hyperglycemia as well. ? ?His weight has been steady and I would like him to lose more weight.  He has lost about 12 to 13 pounds in weight since he started seeing me and he has maintained that weight loss.  Extensive discussion was held regarding making dietary changes and again exercise on a daily basis. ? ?No changes in the medications were done today.  I will see him back in 6 months and if he remains stable on annual basis.   ? ? ?Donald Prows, MD, [facility] ?06/22/2021, 2:46 PM ?Office: [phone] ?Pager: [phone]  ?

## 2021-08-30 ENCOUNTER — Other Ambulatory Visit: Payer: Self-pay

## 2021-08-30 ENCOUNTER — Telehealth: Payer: Self-pay | Admitting: Cardiology

## 2021-08-30 MED ORDER — AMLODIPINE BESYLATE 10 MG PO TABS: 10.0000 mg | ORAL_TABLET | Freq: Every day | ORAL | 0 refills | Status: DC

## 2021-08-30 NOTE — Telephone Encounter (Signed)
Patient's wife requesting refill for amlodipine 10 MG. Preferred pharmacy is the one listed.  ?

## 2021-08-30 NOTE — Telephone Encounter (Signed)
Refill has been sent.  °

## 2021-12-08 ENCOUNTER — Other Ambulatory Visit: Payer: Self-pay

## 2021-12-08 MED ORDER — AMLODIPINE BESYLATE 10 MG PO TABS: 10.0000 mg | ORAL_TABLET | Freq: Every day | ORAL | 3 refills | Status: DC

## 2021-12-28 ENCOUNTER — Ambulatory Visit: Payer: Self-pay | Admitting: Cardiology

## 2021-12-28 LAB — CMP14+EGFR
ALT: 26 IU/L (ref 0–44)
AST: 27 IU/L (ref 0–40)
Albumin/Globulin Ratio: 1.3 (ref 1.2–2.2)
Albumin: 4.1 g/dL (ref 3.8–4.9)
Alkaline Phosphatase: 123 IU/L — ABNORMAL HIGH (ref 44–121)
BUN/Creatinine Ratio: 11 (ref 10–24)
BUN: 11 mg/dL (ref 8–27)
Bilirubin Total: 0.7 mg/dL (ref 0.0–1.2)
CO2: 24 mmol/L (ref 20–29)
Calcium: 8.9 mg/dL (ref 8.6–10.2)
Chloride: 101 mmol/L (ref 96–106)
Creatinine, Ser: 0.99 mg/dL (ref 0.76–1.27)
Globulin, Total: 3.1 g/dL (ref 1.5–4.5)
Glucose: 104 mg/dL — ABNORMAL HIGH (ref 70–99)
Potassium: 4.3 mmol/L (ref 3.5–5.2)
Sodium: 138 mmol/L (ref 134–144)
Total Protein: 7.2 g/dL (ref 6.0–8.5)
eGFR: 87 mL/min/{1.73_m2} (ref >59)

## 2021-12-28 LAB — LIPID PANEL WITH LDL/HDL RATIO
Cholesterol, Total: 115 mg/dL (ref 100–199)
HDL: 35 mg/dL — ABNORMAL LOW (ref >39)
LDL Chol Calc (NIH): 64 mg/dL (ref 0–99)
LDL/HDL Ratio: 1.8 ratio (ref 0.0–3.6)
Triglycerides: 77 mg/dL (ref 0–149)
VLDL Cholesterol Cal: 16 mg/dL (ref 5–40)

## 2021-12-28 LAB — HGB A1C W/O EAG: Hgb A1c MFr Bld: 5.6 % (ref 4.8–5.6)

## 2021-12-29 ENCOUNTER — Encounter: Payer: Self-pay | Admitting: Cardiology

## 2021-12-29 ENCOUNTER — Ambulatory Visit: Payer: Self-pay | Admitting: Cardiology

## 2021-12-29 VITALS — BP 130/70 | HR 68 | Temp 98.4°F | Resp 16 | Ht 64.0 in | Wt 171.6 lb

## 2021-12-29 DIAGNOSIS — I10 Essential (primary) hypertension: Secondary | ICD-10-CM

## 2021-12-29 DIAGNOSIS — E7800 Pure hypercholesterolemia, unspecified: Secondary | ICD-10-CM

## 2021-12-29 DIAGNOSIS — I25118 Atherosclerotic heart disease of native coronary artery with other forms of angina pectoris: Secondary | ICD-10-CM

## 2021-12-29 NOTE — Progress Notes (Signed)
Primary Physician/Referring:  Monico Blitz, MD  Patient ID: Donald Alexander, male    DOB: 28-May-1960, 61 y.o.   MRN: [id number]  No chief complaint on file.  HPI:    Donald Alexander  is a 61 y.o. [place] male patient with remote history of tobacco use, hypertension and hyperlipidemia, CAD by coronary CTA, presents for evaluation of angina pectoris.  [name], since then he has not had any further episodes of angina pectoris.  He is accompanied by his wife.  [name], he has not used sublingual nitroglycerin.  Past Medical History:  Diagnosis Date   Hyperlipidemia    Hypertension    Past Surgical History:  Procedure Laterality Date   BACK SURGERY  2015   GLAUCOMA REPAIR     HERNIA REPAIR  2021   Family History  Problem Relation Age of Onset   Osteoarthritis Mother    Gout Brother     Social History   Tobacco Use   Smoking status: Former    Types: Cigarettes    Quit date: 08/12/1988    Years since quitting: 33.4   Smokeless tobacco: Never  Substance Use Topics   Alcohol use: Yes    Comment: occ   Marital Status: Married   ROS   Review of Systems  Cardiovascular:  Negative for chest pain, dyspnea on exertion and leg swelling.  Gastrointestinal:  Negative for melena.   Objective  Blood pressure 130/70, pulse 68, temperature 98.4 F (36.9 C), temperature source Temporal, resp. rate 16, height '5\' 4"'  (1.626 m), weight 171 lb 9.6 oz (77.8 kg), SpO2 97 %.      12/29/2021    2:35 PM 06/22/2021    2:14 PM 06/22/2021    2:04 PM  Vitals with BMI  Height '5\' 4"'   '5\' 4"'   Weight 171 lbs 10 oz  172 lbs 10 oz  BMI 72.90  21.11  Systolic 552 080 223  Diastolic 70 73 73  Pulse 68 65 68    Physical Exam Neck:     Vascular: No carotid bruit or JVD.  Cardiovascular:     Rate and Rhythm: Normal rate and regular rhythm.     Pulses:  Intact distal pulses.     Heart sounds: Normal heart sounds. No murmur heard.    No gallop.  Pulmonary:     Effort: Pulmonary effort is normal.     Breath sounds: Normal breath sounds.  Abdominal:     General: Bowel sounds are normal.     Palpations: Abdomen is soft.  Musculoskeletal:     Right lower leg: No edema.     Left lower leg: No edema.     Laboratory examination:   Recent Labs    12/27/21 0815  NA 138  K 4.3  CL 101  CO2 24  GLUCOSE 104*  BUN 11  CREATININE 0.99  CALCIUM 8.9   estimated creatinine clearance is 74.7 mL/min (by C-G formula based on SCr of 0.99 mg/dL).     Latest Ref Rng & Units 12/27/2021    8:15 AM 08/12/2020   11:23 AM  CMP  Glucose 70 - 99 mg/dL 104  114   BUN 8 - 27 mg/dL 11  10   Creatinine 0.76 - 1.27 mg/dL 0.99  1.08   Sodium 134 - 144 mmol/L 138  138   Potassium 3.5 -  5.2 mmol/L 4.3  4.6   Chloride 96 - 106 mmol/L 101  99   CO2 20 - 29 mmol/L 24  22   Calcium 8.6 - 10.2 mg/dL 8.9  10.2   Total Protein 6.0 - 8.5 g/dL 7.2    Total Bilirubin 0.0 - 1.2 mg/dL 0.7    Alkaline Phos 44 - 121 IU/L 123    AST 0 - 40 IU/L 27    ALT 0 - 44 IU/L 26     Lipid Panel Recent Labs    12/27/21 0815  CHOL 115  TRIG 77  LDLCALC 64  HDL 35*   Lipid Panel     Component Value Date/Time   CHOL 115 12/27/2021 0815   TRIG 77 12/27/2021 0815   HDL 35 (L) 12/27/2021 0815   LDLCALC 64 12/27/2021 0815   LABVLDL 16 12/27/2021 0815    Lab Results  Component Value Date   HGBA1C 5.6 12/27/2021    Labs 10/27/2020: Apolipo. B/A-1 Ratio 0.0 - 0.7 ratio  0.6  Apolipoprotein A-1 101 - 178 mg/dL  93 Low   Apolipoprotein B <90 mg/dL  60   Lipoprotein (a) <75.0 nmol/L   18.6   External labs:   Labs 04/05/2020:  Uric acid normal at 7.9.  TSH normal at 2.100.  PSA normal at 0.6.  Total cholesterol 253, triglycerides 189, HDL 31, LDL 186.  Hb 14.8/HCT 45.0, platelets 300.  Mild microcytic index.  Serum glucose '1 1 4 ' mg, BUN 11, creatinine 1.03,  EGFR 80 mL, potassium 4.5, CMP otherwise normal.  Medications and allergies   Allergies  Allergen Reactions   Simvastatin Hives     Current Outpatient Medications:    amLODipine (NORVASC) 10 MG tablet, Take 1 tablet (10 mg total) by mouth daily., Disp: 90 tablet, Rfl: 3   ASPIRIN 81 PO, Take 1 tablet by mouth daily., Disp: , Rfl:    atorvastatin (LIPITOR) 40 MG tablet, Take 1 tablet (40 mg total) by mouth daily., Disp: 90 tablet, Rfl: 3   metoprolol succinate (TOPROL-XL) 50 MG 24 hr tablet, TAKE 1 TABLET BY MOUTH ONCE DAILY TAKE  WITH  OR  IMMEDIATELY  FOLLOWING  A  MEAL, Disp: 100 tablet, Rfl: 2   nitroGLYCERIN (NITROSTAT) 0.4 MG SL tablet, Place 1 tablet (0.4 mg total) under the tongue every 5 (five) minutes as needed for up to 25 days for chest pain., Disp: 25 tablet, Rfl: 3   VYZULTA 0.024 % SOLN, Apply 1 drop to eye at bedtime., Disp: , Rfl:    Radiology:   No results found.  Cardiac Studies:   Coronary CT angiogram 2020-09-05: 1. Total coronary calcium score of 237. Percentile 95 Left main: 20.6; Left anterior descending artery: 63.9; Left circumflex artery: 130; Right coronary artery: 22.8 2. Normal coronary origin with right dominance. 3. Severe stenosis (70-99%) at proximal / mid LAD due to noncalcified plaque the lesion is closer to 70% stenosis. 4. Moderate stenosis (50-69%) at mid LCx due to noncalcified plaque. 5. Mild stenosis (25-49%) in the mid / distal segment due to both calcified and noncalcified plaque   1. Left Main: FFR = 0.99 2. LAD: Proximal FFR = 0.98, mid FFR = 0.66, distal FFR = 0.59 3. LCX: Proximal FFR = 0.98, mid FFR = 0.97, distal FFR = 0.79 4. RCA: Proximal FFR = 0.97, mid FFR =0.89, distal FFR = 0.84   IMPRESSION: 1. CT FFR analysis showed significant stenosis at proximal to mid LAD and mid to distal  LCx.   RECOMMENDATIONS: Invasive angiography is recommended to further evaluate the stenosis outline in the LAD and LCX distribution.    EKG:    EKG 12/29/2021: Normal sinus rhythm at rate of 66 bpm, incomplete right bundle branch block, normal EKG.  No change from 06/22/2021.  Assessment     ICD-10-CM   1. Primary hypertension  I10 EKG 12-Lead    2. Coronary artery disease of native artery of native heart with stable angina pectoris ([place])  I25.118     3. Hypercholesteremia  E78.00       There are no discontinued medications.   No orders of the defined types were placed in this encounter.  Orders Placed This Encounter  Procedures   EKG 12-Lead   Recommendations:   Donald Alexander is a 61 y.o.  [place] male patient with remote history of tobacco use, hypertension and hyperlipidemia, CAD by coronary CTA, presents for evaluation of angina pectoris. He is presently doing well and has not had recurrence of angina pectoris.  His wife is present.  I reviewed his labs, lipids under excellent control, including lipid profile testing along with CMP and A1c evaluation for hyperglycemia as well.  His weight has been steady and I would like him to lose more weight.  He has lost about 12 to 13 pounds in weight since he started seeing me and he has maintained that weight loss.  Extensive discussion was held regarding making dietary changes and again exercise on a daily basis.  No changes in the medications were done today.  I will see him back in on an annual basis.    Apart from discussion regarding unstable angina presentation, I spent additional 10 min regarding importance of diet and weight loss for continued secondary prevention.  Adrian Prows, MD, [facility] 12/29/2021, 3:37 PM Office: [phone] Pager: [phone]

## 2022-01-11 ENCOUNTER — Other Ambulatory Visit: Payer: Self-pay | Admitting: Cardiology

## 2022-01-11 DIAGNOSIS — E7800 Pure hypercholesterolemia, unspecified: Secondary | ICD-10-CM

## 2022-05-09 ENCOUNTER — Other Ambulatory Visit: Payer: Self-pay | Admitting: Cardiology

## 2022-05-09 DIAGNOSIS — I25118 Atherosclerotic heart disease of native coronary artery with other forms of angina pectoris: Secondary | ICD-10-CM

## 2022-05-09 MED ORDER — METOPROLOL SUCCINATE ER 50 MG PO TB24: ORAL_TABLET | ORAL | 3 refills | Status: AC

## 2022-05-09 MED ORDER — AMLODIPINE BESYLATE 10 MG PO TABS: 10.0000 mg | ORAL_TABLET | Freq: Every day | ORAL | 3 refills | Status: AC

## 2022-12-31 ENCOUNTER — Ambulatory Visit: Payer: Self-pay | Admitting: Cardiology

## 2023-02-14 ENCOUNTER — Other Ambulatory Visit: Payer: Self-pay | Admitting: Cardiology

## 2023-02-14 DIAGNOSIS — E7800 Pure hypercholesterolemia, unspecified: Secondary | ICD-10-CM

## 2023-03-04 ENCOUNTER — Other Ambulatory Visit: Payer: Self-pay | Admitting: Cardiology

## 2023-04-03 DIAGNOSIS — Z7689 Persons encountering health services in other specified circumstances: Secondary | ICD-10-CM | POA: Diagnosis not present

## 2023-04-03 DIAGNOSIS — I2510 Atherosclerotic heart disease of native coronary artery without angina pectoris: Secondary | ICD-10-CM | POA: Diagnosis not present

## 2023-04-03 DIAGNOSIS — I10 Essential (primary) hypertension: Secondary | ICD-10-CM | POA: Diagnosis not present

## 2023-04-03 DIAGNOSIS — E782 Mixed hyperlipidemia: Secondary | ICD-10-CM | POA: Diagnosis not present

## 2023-07-23 ENCOUNTER — Other Ambulatory Visit: Payer: Self-pay | Admitting: Internal Medicine

## 2023-07-23 DIAGNOSIS — R1011 Right upper quadrant pain: Secondary | ICD-10-CM

## 2023-07-30 ENCOUNTER — Ambulatory Visit
Admission: RE | Admit: 2023-07-30 | Discharge: 2023-07-30 | Disposition: A | Discharge status: Home / Self Care | Source: Ambulatory Visit | Attending: Internal Medicine | Admitting: Internal Medicine

## 2023-07-30 DIAGNOSIS — R1011 Right upper quadrant pain: Secondary | ICD-10-CM | POA: Insufficient documentation

## 2023-09-05 DIAGNOSIS — H401131 Primary open-angle glaucoma, bilateral, mild stage: Secondary | ICD-10-CM | POA: Diagnosis not present

## 2023-09-05 DIAGNOSIS — H2513 Age-related nuclear cataract, bilateral: Secondary | ICD-10-CM | POA: Diagnosis not present

## 2023-09-15 DIAGNOSIS — Z1211 Encounter for screening for malignant neoplasm of colon: Secondary | ICD-10-CM | POA: Diagnosis not present

## 2023-09-15 DIAGNOSIS — Z1212 Encounter for screening for malignant neoplasm of rectum: Secondary | ICD-10-CM | POA: Diagnosis not present

## 2023-10-04 DIAGNOSIS — I2510 Atherosclerotic heart disease of native coronary artery without angina pectoris: Secondary | ICD-10-CM | POA: Diagnosis not present

## 2023-10-11 DIAGNOSIS — I10 Essential (primary) hypertension: Secondary | ICD-10-CM | POA: Diagnosis not present

## 2023-10-11 DIAGNOSIS — E782 Mixed hyperlipidemia: Secondary | ICD-10-CM | POA: Diagnosis not present

## 2023-10-11 DIAGNOSIS — I2510 Atherosclerotic heart disease of native coronary artery without angina pectoris: Secondary | ICD-10-CM | POA: Diagnosis not present

## 2024-03-16 DIAGNOSIS — H401131 Primary open-angle glaucoma, bilateral, mild stage: Secondary | ICD-10-CM | POA: Diagnosis not present

## 2024-03-29 DIAGNOSIS — R600 Localized edema: Secondary | ICD-10-CM | POA: Diagnosis not present

## 2024-04-08 DIAGNOSIS — I10 Essential (primary) hypertension: Secondary | ICD-10-CM | POA: Diagnosis not present

## 2024-04-08 DIAGNOSIS — E782 Mixed hyperlipidemia: Secondary | ICD-10-CM | POA: Diagnosis not present

## 2024-04-08 DIAGNOSIS — I2510 Atherosclerotic heart disease of native coronary artery without angina pectoris: Secondary | ICD-10-CM | POA: Diagnosis not present

## 2024-05-05 ENCOUNTER — Ambulatory Visit: Payer: Self-pay | Admitting: Internal Medicine

## 2024-07-07 ENCOUNTER — Ambulatory Visit: Payer: Self-pay | Admitting: Internal Medicine
